# Patient Record
Sex: Male | Born: 1960 | Race: Black or African American | Hispanic: No | Smoking: Never smoker
Health system: Southern US, Community
[De-identification: ages and names within clinical notes are randomized; demographics above are authoritative.]

## PROBLEM LIST (undated history)

## (undated) DIAGNOSIS — I2510 Atherosclerotic heart disease of native coronary artery without angina pectoris: Secondary | ICD-10-CM

## (undated) DIAGNOSIS — I5021 Acute systolic (congestive) heart failure: Secondary | ICD-10-CM

## (undated) DIAGNOSIS — I7100 Dissection of unspecified site of aorta: Secondary | ICD-10-CM

## (undated) DIAGNOSIS — I480 Paroxysmal atrial fibrillation: Secondary | ICD-10-CM

## (undated) DIAGNOSIS — E785 Hyperlipidemia, unspecified: Secondary | ICD-10-CM

## (undated) DIAGNOSIS — I10 Essential (primary) hypertension: Secondary | ICD-10-CM

## (undated) DIAGNOSIS — I442 Atrioventricular block, complete: Secondary | ICD-10-CM

## (undated) HISTORY — PX: PACEMAKER INSERTION: SHX728

## (undated) HISTORY — PX: ASCENDING AORTIC ANEURYSM REPAIR W/ MECHANICAL AORTIC VALVE REPLACEMENT: SHX1192

---

## 2011-01-22 ENCOUNTER — Emergency Department (HOSPITAL_COMMUNITY)
Admission: EM | Admit: 2011-01-22 | Discharge: 2011-01-22 | Disposition: A | Discharge status: Home / Self Care | Payer: Medicaid Other | Attending: Emergency Medicine | Admitting: Emergency Medicine

## 2011-01-22 ENCOUNTER — Emergency Department (HOSPITAL_COMMUNITY): Payer: Medicaid Other

## 2011-01-22 DIAGNOSIS — I10 Essential (primary) hypertension: Secondary | ICD-10-CM | POA: Insufficient documentation

## 2011-01-22 DIAGNOSIS — M79609 Pain in unspecified limb: Secondary | ICD-10-CM | POA: Insufficient documentation

## 2011-01-22 DIAGNOSIS — Z8679 Personal history of other diseases of the circulatory system: Secondary | ICD-10-CM | POA: Insufficient documentation

## 2011-01-22 DIAGNOSIS — M5430 Sciatica, unspecified side: Secondary | ICD-10-CM | POA: Insufficient documentation

## 2011-01-22 DIAGNOSIS — M25559 Pain in unspecified hip: Secondary | ICD-10-CM | POA: Insufficient documentation

## 2012-03-21 ENCOUNTER — Emergency Department (HOSPITAL_COMMUNITY): Payer: Medicaid Other

## 2012-03-21 ENCOUNTER — Emergency Department (HOSPITAL_COMMUNITY)
Admission: EM | Admit: 2012-03-21 | Discharge: 2012-03-21 | Disposition: A | Discharge status: Skilled Nursing Facility | Payer: Medicaid Other | Attending: Emergency Medicine | Admitting: Emergency Medicine

## 2012-03-21 ENCOUNTER — Encounter (HOSPITAL_COMMUNITY): Payer: Self-pay

## 2012-03-21 DIAGNOSIS — E785 Hyperlipidemia, unspecified: Secondary | ICD-10-CM | POA: Insufficient documentation

## 2012-03-21 DIAGNOSIS — M549 Dorsalgia, unspecified: Secondary | ICD-10-CM | POA: Insufficient documentation

## 2012-03-21 DIAGNOSIS — Z954 Presence of other heart-valve replacement: Secondary | ICD-10-CM | POA: Insufficient documentation

## 2012-03-21 DIAGNOSIS — I712 Thoracic aortic aneurysm, without rupture, unspecified: Secondary | ICD-10-CM | POA: Insufficient documentation

## 2012-03-21 DIAGNOSIS — I509 Heart failure, unspecified: Secondary | ICD-10-CM | POA: Insufficient documentation

## 2012-03-21 DIAGNOSIS — R0602 Shortness of breath: Secondary | ICD-10-CM | POA: Insufficient documentation

## 2012-03-21 DIAGNOSIS — I10 Essential (primary) hypertension: Secondary | ICD-10-CM | POA: Insufficient documentation

## 2012-03-21 DIAGNOSIS — I2510 Atherosclerotic heart disease of native coronary artery without angina pectoris: Secondary | ICD-10-CM | POA: Insufficient documentation

## 2012-03-21 HISTORY — DX: Paroxysmal atrial fibrillation: I48.0

## 2012-03-21 HISTORY — DX: Essential (primary) hypertension: I10

## 2012-03-21 HISTORY — DX: Hyperlipidemia, unspecified: E78.5

## 2012-03-21 HISTORY — DX: Acute systolic (congestive) heart failure: I50.21

## 2012-03-21 HISTORY — DX: Atherosclerotic heart disease of native coronary artery without angina pectoris: I25.10

## 2012-03-21 HISTORY — DX: Dissection of unspecified site of aorta: I71.00

## 2012-03-21 HISTORY — DX: Atrioventricular block, complete: I44.2

## 2012-03-21 LAB — CBC WITH DIFFERENTIAL/PLATELET
Basophils Absolute: 0 10*3/uL (ref 0.0–0.1)
Basophils Relative: 1 % (ref 0–1)
Eosinophils Relative: 4 % (ref 0–5)
Lymphocytes Relative: 20 % (ref 12–46)
Neutro Abs: 3.5 10*3/uL (ref 1.7–7.7)
Platelets: 155 10*3/uL (ref 150–400)
RDW: 14.2 % (ref 11.5–15.5)
WBC: 5.4 10*3/uL (ref 4.0–10.5)

## 2012-03-21 LAB — COMPREHENSIVE METABOLIC PANEL
ALT: 28 U/L (ref 0–53)
AST: 40 U/L — ABNORMAL HIGH (ref 0–37)
Albumin: 4 g/dL (ref 3.5–5.2)
CO2: 24 mEq/L (ref 19–32)
Calcium: 9.7 mg/dL (ref 8.4–10.5)
Chloride: 103 mEq/L (ref 96–112)
GFR calc non Af Amer: 59 mL/min — ABNORMAL LOW (ref >90)
Sodium: 139 mEq/L (ref 135–145)

## 2012-03-21 LAB — PROTIME-INR: Prothrombin Time: 33.2 seconds — ABNORMAL HIGH (ref 11.6–15.2)

## 2012-03-21 LAB — TROPONIN I: Troponin I: <0.3 ng/mL (ref <0.30)

## 2012-03-21 MED ORDER — NITROGLYCERIN 0.4 MG SL SUBL: 0.4000 mg | SUBLINGUAL_TABLET | SUBLINGUAL | Status: DC | PRN

## 2012-03-21 MED ORDER — IOHEXOL 350 MG/ML SOLN
100.0000 mL | Freq: Once | INTRAVENOUS | Status: AC | PRN
  Administered 2012-03-21: 100 mL via INTRAVENOUS

## 2012-03-21 NOTE — ED Notes (Signed)
Patient was brought in by ambulance with complaint of [ain to the lt shoulder blade across his back onset 45 minutes ago when he woke up. Pt denies any chest pain , no SOB, no dizziness, no N/V.

## 2012-03-21 NOTE — ED Provider Notes (Signed)
History     CSN: [id number]  Arrival date & time 03/21/12  0848   First MD Initiated Contact with Patient 03/21/12 [phone]      Chief Complaint  Patient presents with  . Back Pain    (Consider location/radiation/quality/duration/timing/severity/associated sxs/prior treatment) HPI Comments: Pt with hx CAD s/p stents, pacemaker, repaired thoracic aortic dissection, and CHF reports being woken from sleep this morning with pain that begins under his left scapula and radiates down to his left lower back.  Associated SOB.  The symptoms are intermittent, worse with movement.  States his vitals at the time were BP 165/100, HR 73.  Denies chest pain, fevers, cough, visual changes, lightheadedness/dizziness, abdominal pain.  Pt is on coumadin, last INR was 3.8.  PCP is Dr Concepcion Elk.  All specialists including cardiologist and surgeon (thoracic repair) are at [facility]      The history is provided by the patient.    Past Medical History  Diagnosis Date  . Aortic dissection   . HTN (hypertension)   . Coronary artery disease   . Acute systolic heart failure   . Paroxysmal atrial fibrillation   . Hyperlipidemia   . Complete heart block     Past Surgical History  Procedure Date  . Pacemaker insertion   . Ascending aortic aneurysm repair w/ mechanical aortic valve replacement     No family history on file.  History  Substance Use Topics  . Smoking status: Never Smoker   . Smokeless tobacco: Not on file  . Alcohol Use: No      Review of Systems  Constitutional: Negative for fever and chills.  Respiratory: Positive for shortness of breath. Negative for cough.   Cardiovascular: Negative for chest pain.  Gastrointestinal: Negative for nausea, vomiting, abdominal pain, diarrhea and constipation.  Neurological: Negative for dizziness, weakness, light-headedness and numbness.  All other systems reviewed and are negative.    Allergies  Review of patient's allergies indicates no known  allergies.  Home Medications  No current outpatient prescriptions on file.  BP 168/107  Pulse 75  Resp 20  SpO2 98%  Physical Exam  Nursing note and vitals reviewed. Constitutional: He is oriented to person, place, and time. He appears well-developed and well-nourished. No distress.  HENT:  Head: Normocephalic and atraumatic.  Neck: Neck supple.  Cardiovascular: Normal rate, regular rhythm and intact distal pulses.   Murmur heard. Pulmonary/Chest: Breath sounds normal. Tachypnea noted. No respiratory distress. He has no wheezes. He has no rales. He exhibits no tenderness.       Shallow respirations  Abdominal: Soft. Bowel sounds are normal. He exhibits no distension and no mass. There is no tenderness. There is no rebound and no guarding.  Musculoskeletal: He exhibits no tenderness.  Neurological: He is alert and oriented to person, place, and time.  Skin: He is not diaphoretic.    ED Course  Procedures (including critical care time)  Labs Reviewed  CBC WITH DIFFERENTIAL - Abnormal; Notable for the following:    RBC 3.73 (*)     Hemoglobin 11.8 (*)     HCT 35.2 (*)     All other components within normal limits  COMPREHENSIVE METABOLIC PANEL - Abnormal; Notable for the following:    BUN 26 (*)     Creatinine, Ser 1.36 (*)     Total Protein 8.5 (*)     AST 40 (*)     GFR calc non Af Amer 59 (*)     GFR calc Af Denyse Dago  69 (*)     All other components within normal limits  PRO B NATRIURETIC PEPTIDE - Abnormal; Notable for the following:    Pro B Natriuretic peptide (BNP) 4834.0 (*)     All other components within normal limits  PROTIME-INR - Abnormal; Notable for the following:    Prothrombin Time 33.2 (*)     INR 3.19 (*)     All other components within normal limits  TROPONIN I  TROPONIN I   Dg Chest 2 View  03/21/2012  *RADIOLOGY REPORT*  Clinical Data: Left scapular pain, shortness of breath  CHEST - 2 VIEW  Comparison: None.  Findings: No active infiltrate or  effusion is seen.  There is moderate cardiomegaly present.  A single lead permanent pacemaker is noted.  A thoracic aortic stent is present from the ascending aortic arch throughout the descending thoracic aorta.  No bony abnormality is noted.  Surgical clips overlie the right axilla.  IMPRESSION:  1.  Moderate cardiomegaly with permanent pacemaker. 2.  Thoracic aortic stent. 3.  No active lung disease.  Original Report Authenticated By: Juline Patch, M.D.   Ct Angio Chest W/cm &/or Wo Cm  03/21/2012  *RADIOLOGY REPORT*  Clinical Data:  History of aortic dissection, now with back pain and shortness of breath  CT ANGIOGRAPHY CHEST, ABDOMEN AND PELVIS  Technique:  Multidetector CT imaging through the chest, abdomen and pelvis was performed using the standard protocol during bolus administration of intravenous contrast.  Multiplanar reconstructed images including MIPs were obtained and reviewed to evaluate the vascular anatomy.  Contrast: OMNIPAQUE IOHEXOL 350 MG/ML SOLN  Comparison:  Chest radiograph - earlier same day  CTA CHEST  Vascular Findings:  Review of the precontrast images are negative for discrete area of intramural hematoma.  No definite periaortic stranding.  The patient is post open ascending aorta repair and aortic valve replacement.  The patient is post debranching procedure of the aortic arch with all of the great vessels arising from a surgically created anastomosis within the ascending thoracic aorta.  There is a apparent short segment dissection within a mildly aneurysmal left subclavian artery which measures approximately 2 cm in greatest transverse axial dimension (image 7).  There is a dissection involving the mildly aneurysmal right innominate artery (which measures approximately 2.6 cm in the transverse axial dimension - image 25, series five) which extends into the right subclavian artery.  This dissection appears to course adjacent to the right common carotid origin but does not  extend into the proximal aspect of the vessel.  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  The patient is post endovascular repair of the aortic arch and descending thoracic aorta.  The proximal end of the aortic stent graft appears well seated. The distal end of the aortic stent graft is not well opposed against the wall of the distal descending of the thoracic aorta.  There is filling of the native excluded thoracic aorta with serpiginous likely retrograde flow originating caudal to the distal end of the thoracic aortic stent graft (type 1b Endo leak).  External caliber of the native abdominal aorta at the level of the proximal aspect of the descending thoracic aorta measures approximately 6.3 x 4.6 cm in greatest coronal dimension (image 86, series 8).  High dense contrast material is seen within this cranial component of this bilobed thoracic aortic aneurysm, not definitely seen on the precontrast images.(image eight, series 10). The external caliber of the abdominal aorta at the level of the diaphragmatic hiatus is enlarged measuring 5.2 x 5.2 cm (image 128).  Review of the MIP images confirms the above findings.  Cardiomegaly.  Post median sternotomy.  The left anterior chest wall single lead pacemaker tip terminates within the right ventricle.  No pericardial effusion.  Close examination was not tailored for evaluation of the pulmonary arteries, there are no discrete filling defects within the pulmonary arterial tree to suggest acute pulmonary embolism.  Enlarged caliber of the main pulmonary artery, measuring 4 cm in greatest transverse axial dimension.  Nonvascular findings:  There is minimal subsegmental atelectasis adjacent to the descending thoracic aorta.  No focal airspace opacities.   Central airways are patent. Shoddy mediastinal lymph nodes are not enlarged by CT criteria.  No definite mediastinal, hilar or axillary lymphadenopathy  No acute or aggressive osseous abnormalities.  IMPRESSION: 1.  Post open aortic valve replacement and ascending aortic repair.  2.  Post debranching procedure of the aortic arch with all the great cervical vessels arising from a common trunk. Bovine configuration of the great vessels.  There is a short segment dissection involving mildly aneurysmal right innominate/subclavian arteries without definite extension into the right common carotid artery.  There is a short segment dissection involving a mildly aneurysmal segment of the left  subclavian artery.  3. Post endovascular repair of the aortic arch and descending thoracic aorta with findings worrisome for a Type 1b (distal) endoleak. The Endoleak appears to extend to involve the cranial component of the bilobed aneurysmal dilatation of the descending thoracic aorta.  In the absence of prior examinations the stability/chronicity of this Endo leak as well as the external dimensions of the native thoracic aorta is indeterminate. No definite intramural hematoma or peri aortic stranding.  4.  Cardiomegaly with enlargement of the main pulmonary artery, nonspecific but may be seen in setting of pulmonary arterial hypertension. Further evaluation with cardiac echo may be performed as clinically indicated.  CTA ABDOMEN AND PELVIS  Vascular Findings:  Abdominal aorta: The thoracic aortic dissection extends throughout the entirety of the abdominal aorta and into the left common iliac artery, terminating at the left common iliac artery's bifurcation. There is no definite periaortic stranding.  There is mild the abdominal aorta is mildly diffusely enlarged, measuring 3.4 x 3.1 cm in greatest transverse axial dimension (image that 174, series 5 - immediately caudal to the takeoff of the IMA).  Celiac artery: Arises from the  true lumen of the abdominal aorta. Noncalcified mural thrombus results in approximately 50% luminal narrowing of the origin of the celiac artery.  The remainder of the vessels widely patent dynamically significant stenosis. Conventional branching pattern.  Superior mesenteric artery:  Arises from the true lumen of the abdominal aorta. Widely patent without hemodynamically significant stenosis.  Conventional branching pattern.  Right renal artery:  Arises from the true lumen of the abdominal aorta.  Widely patent without any significant stenosis.  Left renal artery:  Arises from the true lumen of the abdominal aorta.  There is likely 50% luminal narrowing of the origin of the left renal artery as the vessels origin is immediately adjacent to the lateral heads of the abdominal aortic dissection.  The remainder of the vessel was widely patent.  There is no definite evidence of ischemic change within the  left kidney.  IMA:  Arises from the true lumen of the abdominal aorta.  Widely patent.  Pelvic vasculature:  As stated above, the abdominal aortic aneurysm extends throughout the left common iliac artery, terminating at the vessel's bifurcation.  The left external iliac artery is mildly tortuous and aneurysmal measuring 2.3 cm in diameter.  The left external and internal iliac arteries are widely patent.  There is mild ectasia and tortuosity of the right common and external iliac arteries without any focally significant stenosis.  Review of the MIP images confirms the above findings.  Nonvascular findings:  No discrete hyperenhancing hepatic lesions.  Normal appearance of the gallbladder.  No ascites.  There is symmetric enhancement of the bilateral kidneys.  No definite renal stones.  No urinary obstruction.  No perinephric stranding. There is mild nodularity of the crux of the right adrenal gland, too small to accurately characterize.  There is minimal thickening of the crux and lateral limb of the left adrenal gland,  too small to fully characterize. Normal appearance of the pancreas and spleen.  Colonic diverticulosis without evidence of diverticulitis.  The bowel is otherwise normal in course and caliber without wall thickening or evidence of obstruction.  Normal appearance of the appendix.  No pneumoperitoneum, pneumatosis or portal venous gas. Shoddy retroperitoneal lymph nodes are not enlarged by CT criteria. Normal pelvic organs.  No free fluid in the pelvis.  No acute or aggressive osseous abnormalities. Lumbar spine degenerative change.  IMPRESSION: 1.  There is extension of the thoracic aortic dissection throughout the entirety of the abdominal aorta.  The abdominal aorta is mildly aneurysmal measuring approximately 3.4 cm in greatest transverse axial dimension. 2.  All of the great vessels of the abdomen are rise from the apparent true lumen of the abdominal aortic dissection.  Mural thrombus results in approximately 60% luminal narrowing of the origin of the celiac artery. There is a possible possible hemodynamically significant narrowing of the origin of the left renal artery.  There is no definite evidence of end organ ischemia. 3.  The abdominal aortic aneurysm extends throughout the entirety of the left common iliac artery, terminating at the vessel's bifurcation.  The left common iliac artery is noted to be mildly aneurysmal and ectatic.  Above findings discussed with Chad, [place] at 1323.  Original Report Authenticated By: Waynard Reeds, M.D.   Ct Angio Abd/pel W/ And/or W/o  03/21/2012  *RADIOLOGY REPORT*  Clinical Data:  History of aortic dissection, now with back pain and shortness of breath  CT ANGIOGRAPHY CHEST, ABDOMEN AND PELVIS  Technique:  Multidetector CT imaging through the chest, abdomen and pelvis was performed using the standard protocol during bolus administration of intravenous contrast.  Multiplanar reconstructed images including MIPs were obtained and reviewed to evaluate the vascular anatomy.   Contrast: OMNIPAQUE IOHEXOL 350 MG/ML SOLN  Comparison:  Chest radiograph - earlier same day  CTA CHEST  Vascular Findings:  Review of the precontrast images are negative for discrete area of intramural hematoma.  No definite periaortic stranding.  The patient is post open ascending aorta repair and aortic valve replacement.  The patient is post debranching procedure of the aortic arch with all of the great vessels arising from a surgically created anastomosis within the ascending thoracic aorta.  There is a apparent short segment dissection within a mildly aneurysmal left subclavian artery which measures approximately 2 cm in greatest transverse axial dimension (image 7).  There is a dissection involving the mildly aneurysmal  right innominate artery (which measures approximately 2.6 cm in the transverse axial dimension - image 25, series five) which extends into the right subclavian artery.  This dissection appears to course adjacent to the right common carotid origin but does not extend into the proximal aspect of the vessel.  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  The patient is post endovascular repair of the aortic arch and descending thoracic aorta.  The proximal end of the aortic stent graft appears well seated. The distal end of the aortic stent graft is not well opposed against the wall of the distal descending of the thoracic aorta.  There is filling of the native excluded thoracic aorta with serpiginous likely retrograde flow originating caudal to the distal end of the thoracic aortic stent graft (type 1b Endo leak).  External caliber of the native abdominal aorta at the level of the proximal aspect of the descending thoracic aorta measures approximately 6.3 x 4.6 cm in greatest  coronal dimension (image 86, series 8).  High dense contrast material is seen within this cranial component of this bilobed thoracic aortic aneurysm, not definitely seen on the precontrast images.(image eight, series 10). The external caliber of the abdominal aorta at the level of the diaphragmatic hiatus is enlarged measuring 5.2 x 5.2 cm (image 128).  Review of the MIP images confirms the above findings.  Cardiomegaly.  Post median sternotomy.  The left anterior chest wall single lead pacemaker tip terminates within the right ventricle.  No pericardial effusion.  Close examination was not tailored for evaluation of the pulmonary arteries, there are no discrete filling defects within the pulmonary arterial tree to suggest acute pulmonary embolism.  Enlarged caliber of the main pulmonary artery, measuring 4 cm in greatest transverse axial dimension.  Nonvascular findings:  There is minimal subsegmental atelectasis adjacent to the descending thoracic aorta.  No focal airspace opacities.  Central airways are patent. Shoddy mediastinal lymph nodes are not enlarged by CT criteria.  No definite mediastinal, hilar or axillary lymphadenopathy  No acute or aggressive osseous abnormalities.  IMPRESSION: 1.  Post open aortic valve replacement and ascending aortic repair.  2.  Post debranching procedure of the aortic arch with all the great cervical vessels arising from a common trunk. Bovine configuration of the great vessels.  There is a short segment dissection involving mildly aneurysmal right innominate/subclavian arteries without definite extension into the right common carotid artery.  There is a short segment dissection involving a mildly aneurysmal segment of the left  subclavian artery.  3. Post endovascular repair of the aortic arch and descending thoracic aorta with findings worrisome for a Type 1b (distal) endoleak. The Endoleak appears to extend to involve the cranial component of the bilobed aneurysmal  dilatation of the descending thoracic aorta.  In the absence of prior examinations the stability/chronicity of this Endo leak as well as the external dimensions of the native thoracic aorta is indeterminate. No definite intramural hematoma or peri aortic stranding.  4.  Cardiomegaly with enlargement of the main pulmonary artery, nonspecific but may be seen in setting of pulmonary arterial hypertension. Further evaluation with cardiac echo may be performed as clinically indicated.  CTA ABDOMEN AND PELVIS  Vascular Findings:  Abdominal aorta: The thoracic aortic dissection extends throughout the entirety of  the abdominal aorta and into the left common iliac artery, terminating at the left common iliac artery's bifurcation. There is no definite periaortic stranding.  There is mild the abdominal aorta is mildly diffusely enlarged, measuring 3.4 x 3.1 cm in greatest transverse axial dimension (image that 174, series 5 - immediately caudal to the takeoff of the IMA).  Celiac artery: Arises from the true lumen of the abdominal aorta. Noncalcified mural thrombus results in approximately 50% luminal narrowing of the origin of the celiac artery.  The remainder of the vessels widely patent dynamically significant stenosis. Conventional branching pattern.  Superior mesenteric artery:  Arises from the true lumen of the abdominal aorta. Widely patent without hemodynamically significant stenosis.  Conventional branching pattern.  Right renal artery:  Arises from the true lumen of the abdominal aorta.  Widely patent without any significant stenosis.  Left renal artery:  Arises from the true lumen of the abdominal aorta.  There is likely 50% luminal narrowing of the origin of the left renal artery as the vessels origin is immediately adjacent to the lateral heads of the abdominal aortic dissection.  The remainder of the vessel was widely patent.  There is no definite evidence of ischemic change within the left kidney.  IMA:  Arises  from the true lumen of the abdominal aorta.  Widely patent.  Pelvic vasculature:  As stated above, the abdominal aortic aneurysm extends throughout the left common iliac artery, terminating at the vessel's bifurcation.  The left external iliac artery is mildly tortuous and aneurysmal measuring 2.3 cm in diameter.  The left external and internal iliac arteries are widely patent.  There is mild ectasia and tortuosity of the right common and external iliac arteries without any focally significant stenosis.  Review of the MIP images confirms the above findings.  Nonvascular findings:  No discrete hyperenhancing hepatic lesions.  Normal appearance of the gallbladder.  No ascites.  There is symmetric enhancement of the bilateral kidneys.  No definite renal stones.  No urinary obstruction.  No perinephric stranding. There is mild nodularity of the crux of the right adrenal gland, too small to accurately characterize.  There is minimal thickening of the crux and lateral limb of the left adrenal gland, too small to fully characterize. Normal appearance of the pancreas and spleen.  Colonic diverticulosis without evidence of diverticulitis.  The bowel is otherwise normal in course and caliber without wall thickening or evidence of obstruction.  Normal appearance of the appendix.  No pneumoperitoneum, pneumatosis or portal venous gas. Shoddy retroperitoneal lymph nodes are not enlarged by CT criteria. Normal pelvic organs.  No free fluid in the pelvis.  No acute or aggressive osseous abnormalities. Lumbar spine degenerative change.  IMPRESSION: 1.  There is extension of the thoracic aortic dissection throughout the entirety of the abdominal aorta.  The abdominal aorta is mildly aneurysmal measuring approximately 3.4 cm in greatest transverse axial dimension. 2.  All of the great vessels of the abdomen are rise from the apparent true lumen of the abdominal aortic dissection.  Mural thrombus results in approximately 60% luminal  narrowing of the origin of the celiac artery. There is a possible possible hemodynamically significant narrowing of the origin of the left renal artery.  There is no definite evidence of end organ ischemia. 3.  The abdominal aortic aneurysm extends throughout the entirety of the left common iliac artery, terminating at the vessel's bifurcation.  The left common iliac artery is noted to be mildly aneurysmal and ectatic.  Above  findings discussed with Chad, [place] at 1323.  Original Report Authenticated By: Waynard Reeds, M.D.    9:52 AM Patient seen and examined.  Pt declines pain medication at this time.  9:54 AM I have requested that the secretary attempt to find old EKG from [place].      10:09 AM Discussed patient with Dr Clarene Duke. Per our discussion, will hold patient's morning meds until CT is done and we know whether patient will be going to surgery, etc.  Plan is for CT angio chest, abdomen, pelvis to evaluate aorta.     Date: 03/21/2012  Rate: 61  Rhythm: atrial fibrillation with aberrant complexes  QRS Axis: right  Intervals: normal  ST/T Wave abnormalities: ST depressions laterally, inverted t waves  Conduction Disutrbances:nonspecific intraventricular conduction delay  Narrative Interpretation:   Old EKG Reviewed: changes noted  12:24 PM Patient reports he is feeling better.  Is on O2 by [place], continues to appear tachypneic.    3:50 PM Patient is stable for transfer.  Has been seen by Dr Clarene Duke.  I have also spoken with the ER charge nurse, Liset, who is aware patient is being transferred and that CT surgery will see him in the ED.     1. Back pain   2. SOB (shortness of breath)   3. Thoracic aortic aneurysm   4. CAD (coronary artery disease)   5. CHF (congestive heart failure)       MDM  Patient with hx CAD, CHF, complicated repair of thoracic aortic aneurysm p/w left back pain and SOB.  CT with multiple findings that would be concerning if acute, unable to tell if this is the  case as patient gets all of his care at [facility] and had surgery at [facility].  I spoke with Dr Donella Stade, [facility] cardiothoracic surgeon on call, who spoke with Dr Barbara Cower, the patient's surgeon.  Initially, they recommended d/c home.  However, after our conversation they agreed to see patient in ED to evaluate him and to see the CT scan on CD to compare to patient's baseline, determine if any immediate intervention is necessary.  Patient has abnormal EKG, though it is only slightly different from ekg faxed from [facility], has had two negative troponins, and has had stable vital signs throughout visit.  Pt does have elevated BNP.  I requested labs from [facility] to have something for comparison but I have not received them yet.  Pt felt to be stable for transfer, would benefit from being seen by his own specialist/surgical group who have access to his previous scans and records.  Patient verbalizes understanding and agrees with plan.          Dillard Cannon [facility]) [place], [place] 03/21/12 [phone]

## 2012-03-21 NOTE — ED Notes (Signed)
Pt changed into gown upon arrival to ED. 

## 2012-03-21 NOTE — ED Provider Notes (Signed)
51yo M, c/o left sided mid back pain rad into low back.  Assoc with SOB. Woke up with pain.  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  EKG with afib, TWI and ST depressions inf-lat; grossly unchanged from previous EKG completed at [facility] 12/07/2011.  CT C/A/P with multiple post-surgical changes, no old to compare.  Pt states he continues to feel "better" since arrival to the ED and does not want any pain meds.  VS remain stable, resps easy/CTA, HR irreg/irreg, abd soft/NT, A&O, neuro non-focal.  [facility] CT Surgeon called, case discussed, will accept transfer to [facility] ED for further eval.  [employer]ads Tech to make CD of CT scans before transport.     Medical screening examination/treatment/procedure(s) were conducted as a shared visit with non-physician practitioner(s) and myself.  I personally evaluated the patient during the encounter [facility]   Laray Anger, DO 03/21/12 1634

## 2012-03-23 NOTE — ED Provider Notes (Signed)
Medical screening examination/treatment/procedure(s) were conducted as a shared visit with non-physician practitioner(s) and myself.  I personally evaluated the patient during the encounter Please see my previous note.  Laray Anger, DO 03/23/12 0740

## 2013-01-25 IMAGING — CT CT ANGIO CHEST
2 of 10 series · 12 of 46 positions shown · IV contrast (APPLIED)
Comparison: Chest radiograph - earlier same day

CLINICAL DATA: History of aortic dissection, now with back pain
and shortness of breath

CT ANGIOGRAPHY CHEST, ABDOMEN AND PELVIS
TECHNIQUE: Multidetector CT imaging through the chest, abdomen and
pelvis was performed using the standard protocol during bolus
administration of intravenous contrast.  Multiplanar reconstructed
images including MIPs were obtained and reviewed to evaluate the
vascular anatomy.
Contrast: 100mL OMNIPAQUE IOHEXOL 350 MG/ML SOLN

[Series 5: dissection 2.0 st · axial · 0.75mm/px · z∈[-670,-134]mm · 10 of 314 slices shown]
[im 23/314  lung]
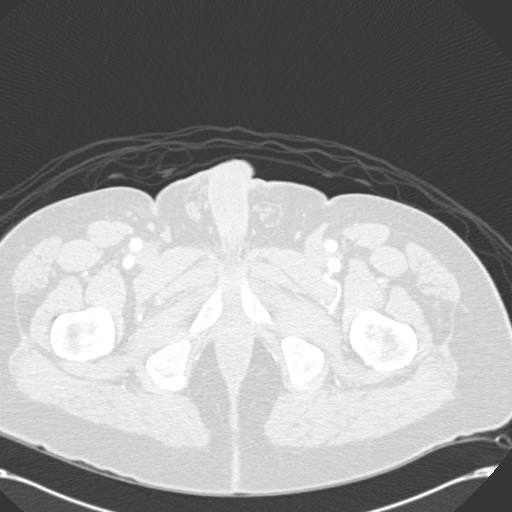
[im 45/314  soft-tissue]
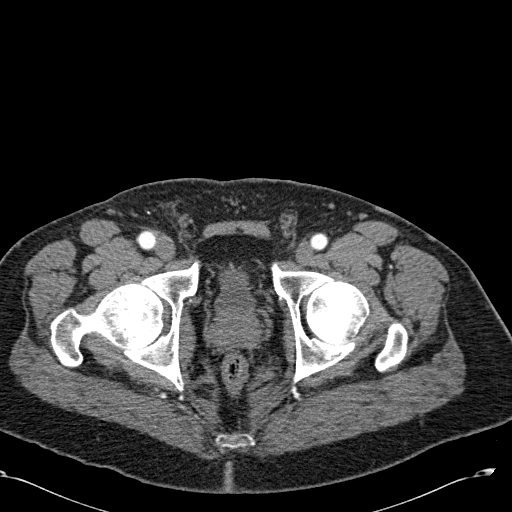
[im 90/314  lung]
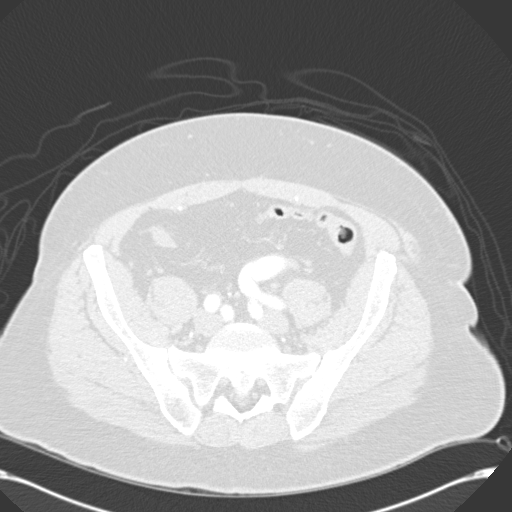
[im 112/314  soft-tissue]
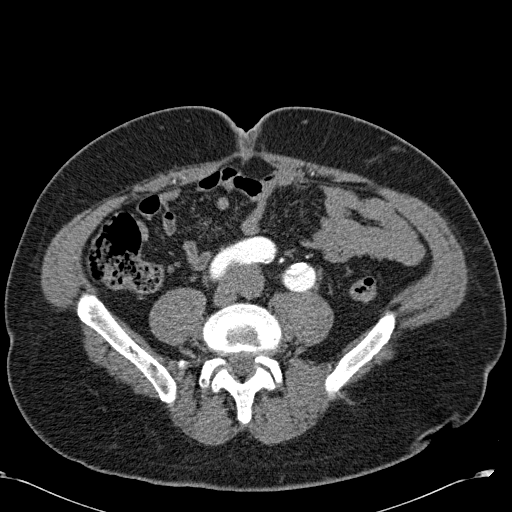
[im 135/314  lung]
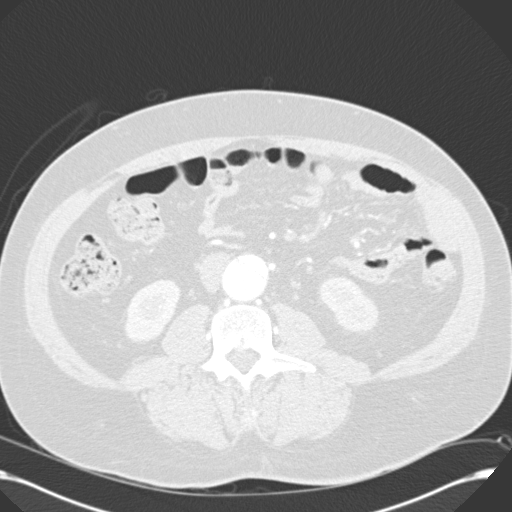
[im 179/314  soft-tissue]
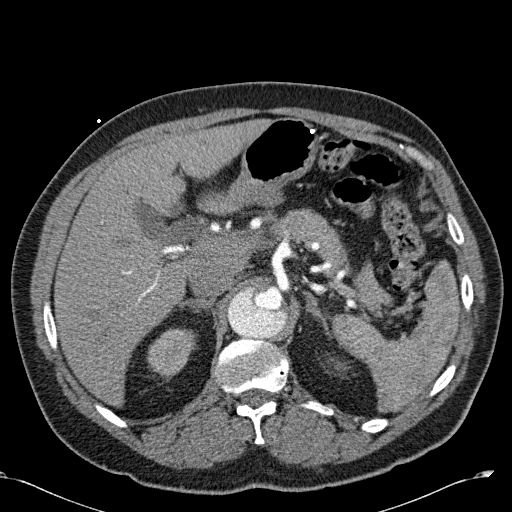
[im 202/314  lung]
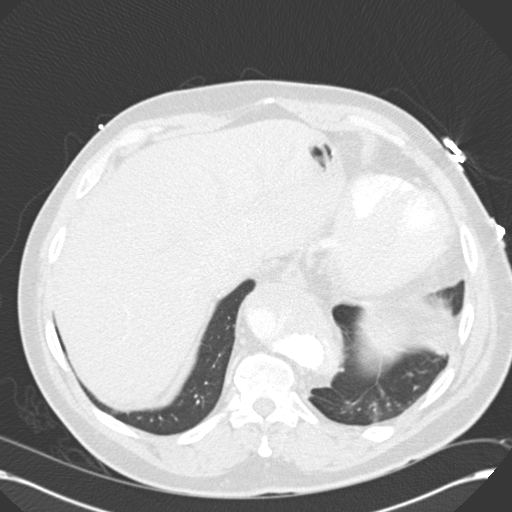
[im 224/314  soft-tissue]
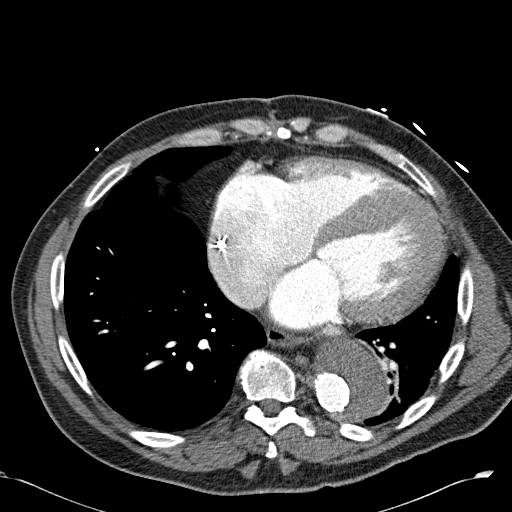
[im 269/314  lung]
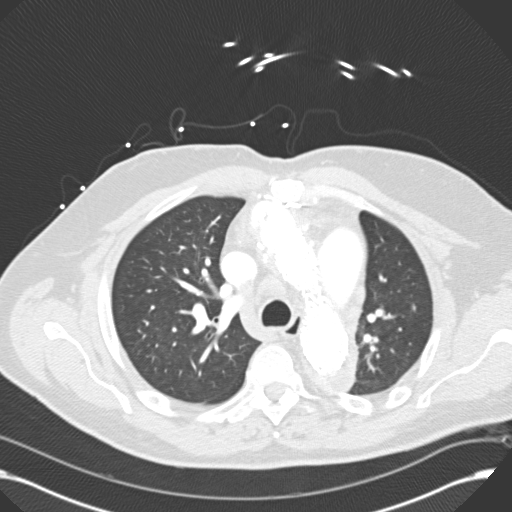
[im 291/314  soft-tissue]
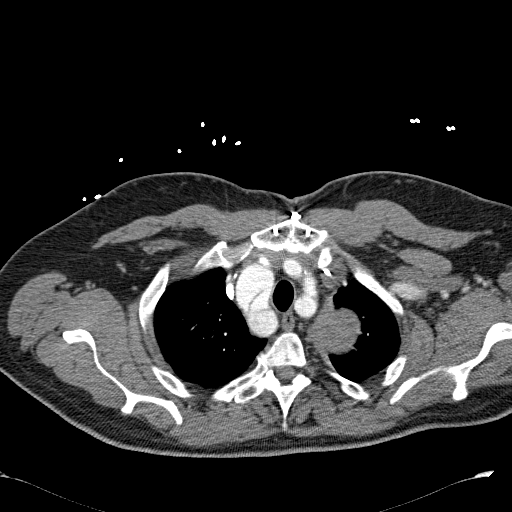

[Series 8: coronals · coronal · 0.83mm/px · 2 of 136 slices shown]
[im 46/136  soft-tissue]
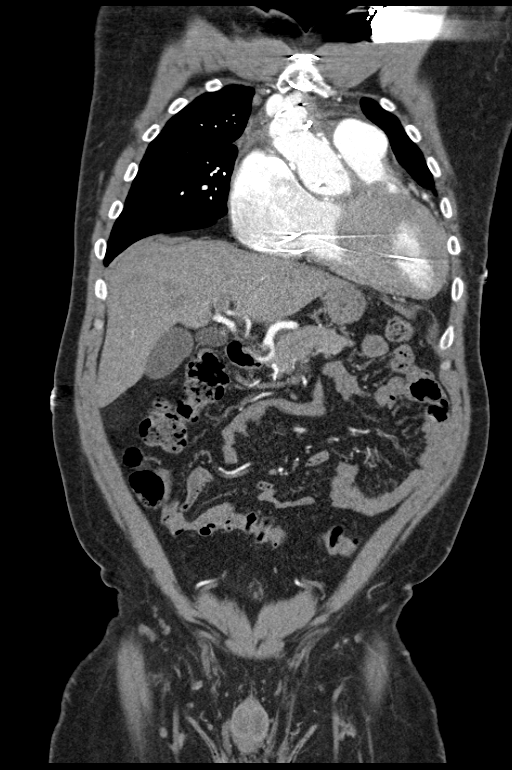
[im 91/136  soft-tissue]
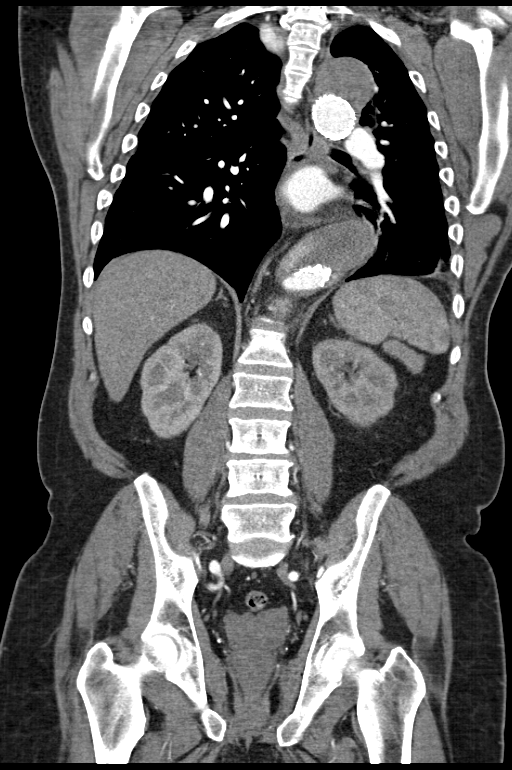

[12 of 46 positions shown; findings below may reference images not displayed]

CTA CHEST

Vascular Findings:

Review of the precontrast images are negative for discrete area of
intramural hematoma.  No definite periaortic stranding.

The patient is post open ascending aorta repair and aortic valve
replacement.

The patient is post debranching procedure of the aortic arch with
all of the great vessels arising from a surgically created
anastomosis within the ascending thoracic aorta.  There is a
apparent short segment dissection within a mildly aneurysmal left
subclavian artery which measures approximately 2 cm in greatest
transverse axial dimension (image 7).  There is a dissection
involving the mildly aneurysmal right innominate artery (which
measures approximately 2.6 cm in the transverse axial dimension -
image 25, series five) which extends into the right subclavian
artery.  This dissection appears to course adjacent to the right
common carotid origin but does not extend into the proximal aspect
of the vessel.  There is a likely a bovine configuration of the
aortic arch as the left common carotid artery arise from the
proximal aspect of the right innominate artery.

 There is a bilobed aneurysm involving the proximal and distal
aspects of the descending thoracic aorta.  The patient is post
endovascular repair of the aortic arch and descending thoracic
aorta.  The proximal end of the aortic stent graft appears well
seated. The distal end of the aortic stent graft is not well
opposed against the wall of the distal descending of the thoracic
aorta.  There is filling of the native excluded thoracic aorta with
serpiginous likely retrograde flow originating caudal to the distal
end of the thoracic aortic stent graft (type 1b Endo leak).

External caliber of the native abdominal aorta at the level of the
proximal aspect of the descending thoracic aorta measures
approximately 6.3 x 4.6 cm in greatest coronal dimension (image 86,
series 8).  High dense contrast material is seen within this
cranial component of this bilobed thoracic aortic aneurysm, not
definitely seen on the precontrast images.(image eight, series 10).
The external caliber of the abdominal aorta at the level of the
diaphragmatic hiatus is enlarged measuring 5.2 x 5.2 cm (image
128).

Review of the MIP images confirms the above findings.

Cardiomegaly.  Post median sternotomy.  The left anterior chest
wall single lead pacemaker tip terminates within the right
ventricle.  No pericardial effusion.  Close examination was not
tailored for evaluation of the pulmonary arteries, there are no
discrete filling defects within the pulmonary arterial tree to
suggest acute pulmonary embolism.  Enlarged caliber of the main
pulmonary artery, measuring 4 cm in greatest transverse axial
dimension.

Nonvascular findings:

There is minimal subsegmental atelectasis adjacent to the
descending thoracic aorta.  No focal airspace opacities.  Central
airways are patent. Shoddy mediastinal lymph nodes are not enlarged
by CT criteria.  No definite mediastinal, hilar or axillary
lymphadenopathy

No acute or aggressive osseous abnormalities.
IMPRESSION: 1.  Post open aortic valve replacement and ascending aortic repair.

2.  Post debranching procedure of the aortic arch with all the
great cervical vessels arising from a common trunk. Bovine
configuration of the great vessels.  There is a short segment
dissection involving mildly aneurysmal right innominate/subclavian
arteries without definite extension into the right common carotid
artery.  There is a short segment dissection involving a mildly
aneurysmal segment of the left  subclavian artery.

3. Post endovascular repair of the aortic arch and descending
thoracic aorta with findings worrisome for a Type 1b (distal)
endoleak. The Endoleak appears to extend to involve the cranial
component of the bilobed aneurysmal dilatation of the descending
thoracic aorta.  In the absence of prior examinations the
stability/chronicity of this Endo leak as well as the external
dimensions of the native thoracic aorta is indeterminate. No
definite intramural hematoma or peri aortic stranding.

4.  Cardiomegaly with enlargement of the main pulmonary artery,
nonspecific but may be seen in setting of pulmonary arterial
hypertension. Further evaluation with cardiac echo may be performed
as clinically indicated.

CTA ABDOMEN AND PELVIS

Vascular Findings:

Abdominal aorta: The thoracic aortic dissection extends throughout
the entirety of the abdominal aorta and into the left common iliac
artery, terminating at the left common iliac artery's bifurcation.
There is no definite periaortic stranding.  There is mild the
abdominal aorta is mildly diffusely enlarged, measuring 3.4 x
cm in greatest transverse axial dimension (image that 174, series 5
- immediately caudal to the takeoff of the IMA).

Celiac artery: Arises from the true lumen of the abdominal aorta.
Noncalcified mural thrombus results in approximately 50% luminal
narrowing of the origin of the celiac artery.  The remainder of the
vessels widely patent dynamically significant stenosis.
Conventional branching pattern.

Superior mesenteric artery:  Arises from the true lumen of the
abdominal aorta. Widely patent without hemodynamically significant
stenosis.  Conventional branching pattern.

Right renal artery:  Arises from the true lumen of the abdominal
aorta.  Widely patent without any significant stenosis.

Left renal artery:  Arises from the true lumen of the abdominal
aorta.  There is likely 50% luminal narrowing of the origin of the
left renal artery as the vessels origin is immediately adjacent to
the lateral heads of the abdominal aortic dissection.  The
remainder of the vessel was widely patent.  There is no definite
evidence of ischemic change within the left kidney.

IMA:  Arises from the true lumen of the abdominal aorta.  Widely
patent.

Pelvic vasculature:  As stated above, the abdominal aortic aneurysm
extends throughout the left common iliac artery, terminating at the
vessel's bifurcation.  The left external iliac artery is mildly
tortuous and aneurysmal measuring 2.3 cm in diameter.  The left
external and internal iliac arteries are widely patent.  There is
mild ectasia and tortuosity of the right common and external iliac
arteries without any focally significant stenosis.

Review of the MIP images confirms the above findings.

Nonvascular findings:

No discrete hyperenhancing hepatic lesions.  Normal appearance of
the gallbladder.  No ascites.  There is symmetric enhancement of
the bilateral kidneys.  No definite renal stones.  No urinary
obstruction.  No perinephric stranding. There is mild nodularity of
the crux of the right adrenal gland, too small to accurately
characterize.  There is minimal thickening of the crux and lateral
limb of the left adrenal gland, too small to fully characterize.
Normal appearance of the pancreas and spleen.

Colonic diverticulosis without evidence of diverticulitis.  The
bowel is otherwise normal in course and caliber without wall
thickening or evidence of obstruction.  Normal appearance of the
appendix.  No pneumoperitoneum, pneumatosis or portal venous gas.
Shoddy retroperitoneal lymph nodes are not enlarged by CT criteria.
Normal pelvic organs.  No free fluid in the pelvis.

No acute or aggressive osseous abnormalities. Lumbar spine
degenerative change.
IMPRESSION: 1.  There is extension of the thoracic aortic dissection throughout
the entirety of the abdominal aorta.  The abdominal aorta is mildly
aneurysmal measuring approximately 3.4 cm in greatest transverse
axial dimension.
2.  All of the great vessels of the abdomen are rise from the
apparent true lumen of the abdominal aortic dissection.  Mural
thrombus results in approximately 60% luminal narrowing of the
origin of the celiac artery. There is a possible possible
hemodynamically significant narrowing of the origin of the left
renal artery.  There is no definite evidence of end organ ischemia.
3.  The abdominal aortic aneurysm extends throughout the entirety
of the left common iliac artery, terminating at the vessel's
bifurcation.  The left common iliac artery is noted to be mildly
aneurysmal and ectatic.

Above findings discussed with [place], PA at 0111.

## 2013-12-16 ENCOUNTER — Telehealth: Payer: Self-pay

## 2013-12-16 NOTE — Telephone Encounter (Signed)
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 Stated he seen Dr. Neva SeatGreene. [phone]

## 2013-12-28 NOTE — Telephone Encounter (Signed)
Pt called in regards to his DOT physical, he saw Dr.Green; [phone]

## 2013-12-28 NOTE — Telephone Encounter (Signed)
Patient states that cardiologist sent over needed information to Dr. Neva SeatGreene for DOT clearance.  Wants to know if Dr. Neva SeatGreene has reviewed new information and cleared patient.  Please advise.

## 2013-12-28 NOTE — Telephone Encounter (Signed)
LMVM to CB to get further information.

## 2014-01-01 NOTE — Telephone Encounter (Signed)
Please pull paper chart - I did receive the clearance letter form cardiologist, with notation of stable CHF and other cardiovascular conditions, along with ability to achieve at least 6 mets of physical work. The other missing information at this point is one month of stable INR's as he had some dosing changes prior to last ov. Once I have this information, can review to determine if able to provide card. Thanks. -JG.

## 2014-01-03 NOTE — Telephone Encounter (Signed)
Locating chart- put in your box with the letter information.

## 2014-01-05 NOTE — Telephone Encounter (Signed)
Called patient advised that we need one month of stable INR.  He stated he goes for that next Friday and he will have his doctor fax over a copy.  Stated he only goes once a month.

## 2014-01-05 NOTE — Telephone Encounter (Signed)
Received chart.  Still need INR's for 1 month documenting stability. Thanks. Paper chart in my box if needed.

## 2014-09-03 DEATH — deceased
